# Patient Record
Sex: Female | Born: 1995 | Race: White | Hispanic: No | Marital: Married | Smoking: Never smoker
Health system: Southern US, Community
[De-identification: ages and names within clinical notes are randomized; demographics above are authoritative.]

## PROBLEM LIST (undated history)

## (undated) DIAGNOSIS — Z789 Other specified health status: Secondary | ICD-10-CM

## (undated) DIAGNOSIS — E739 Lactose intolerance, unspecified: Secondary | ICD-10-CM

## (undated) DIAGNOSIS — R197 Diarrhea, unspecified: Secondary | ICD-10-CM

## (undated) DIAGNOSIS — H5210 Myopia, unspecified eye: Secondary | ICD-10-CM

## (undated) DIAGNOSIS — R109 Unspecified abdominal pain: Secondary | ICD-10-CM

## (undated) HISTORY — DX: Diarrhea, unspecified: R19.7

## (undated) HISTORY — DX: Unspecified abdominal pain: R10.9

## (undated) HISTORY — PX: NO PAST SURGERIES: SHX2092

## (undated) HISTORY — DX: Myopia, unspecified eye: H52.10

## (undated) HISTORY — DX: Lactose intolerance, unspecified: E73.9

## (undated) HISTORY — DX: Other specified health status: Z78.9

---
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
## 2017-12-24 ENCOUNTER — Emergency Department (HOSPITAL_COMMUNITY)

## 2017-12-24 ENCOUNTER — Other Ambulatory Visit: Payer: Self-pay

## 2017-12-24 ENCOUNTER — Emergency Department (HOSPITAL_COMMUNITY)
Admission: EM | Admit: 2017-12-24 | Discharge: 2017-12-25 | Disposition: A | Discharge status: Home / Self Care | Attending: Emergency Medicine | Admitting: Emergency Medicine

## 2017-12-24 ENCOUNTER — Encounter (HOSPITAL_COMMUNITY): Payer: Self-pay | Admitting: Emergency Medicine

## 2017-12-24 DIAGNOSIS — R110 Nausea: Secondary | ICD-10-CM | POA: Insufficient documentation

## 2017-12-24 DIAGNOSIS — R1010 Upper abdominal pain, unspecified: Secondary | ICD-10-CM

## 2017-12-24 DIAGNOSIS — R1011 Right upper quadrant pain: Secondary | ICD-10-CM | POA: Insufficient documentation

## 2017-12-24 LAB — CBC WITH DIFFERENTIAL/PLATELET
Basophils Absolute: 0 10*3/uL (ref 0.0–0.1)
Basophils Relative: 1 %
Eosinophils Absolute: 0.1 10*3/uL (ref 0.0–0.7)
Eosinophils Relative: 2 %
HEMATOCRIT: 39.6 % (ref 36.0–46.0)
Hemoglobin: 12.7 g/dL (ref 12.0–15.0)
LYMPHS PCT: 50 %
Lymphs Abs: 2.9 10*3/uL (ref 0.7–4.0)
MCH: 28 pg (ref 26.0–34.0)
MCHC: 32.1 g/dL (ref 30.0–36.0)
MCV: 87.2 fL (ref 78.0–100.0)
MONO ABS: 0.4 10*3/uL (ref 0.1–1.0)
MONOS PCT: 7 %
NEUTROS ABS: 2.3 10*3/uL (ref 1.7–7.7)
Neutrophils Relative %: 40 %
Platelets: 290 10*3/uL (ref 150–400)
RBC: 4.54 MIL/uL (ref 3.87–5.11)
RDW: 13.7 % (ref 11.5–15.5)
WBC: 5.9 10*3/uL (ref 4.0–10.5)

## 2017-12-24 LAB — HEPATIC FUNCTION PANEL
ALT: 12 U/L — AB (ref 14–54)
AST: 18 U/L (ref 15–41)
Albumin: 4.1 g/dL (ref 3.5–5.0)
Alkaline Phosphatase: 68 U/L (ref 38–126)
BILIRUBIN DIRECT: 0.1 mg/dL (ref 0.1–0.5)
Indirect Bilirubin: 0.5 mg/dL (ref 0.3–0.9)
TOTAL PROTEIN: 6.9 g/dL (ref 6.5–8.1)
Total Bilirubin: 0.6 mg/dL (ref 0.3–1.2)

## 2017-12-24 LAB — URINALYSIS, ROUTINE W REFLEX MICROSCOPIC
Bilirubin Urine: NEGATIVE
GLUCOSE, UA: NEGATIVE mg/dL
KETONES UR: 5 mg/dL — AB
Nitrite: NEGATIVE
PROTEIN: 100 mg/dL — AB
Specific Gravity, Urine: 1.026 (ref 1.005–1.030)
pH: 5 (ref 5.0–8.0)

## 2017-12-24 LAB — BASIC METABOLIC PANEL
Anion gap: 8 (ref 5–15)
BUN: 10 mg/dL (ref 6–20)
CALCIUM: 9.3 mg/dL (ref 8.9–10.3)
CO2: 25 mmol/L (ref 22–32)
CREATININE: 0.69 mg/dL (ref 0.44–1.00)
Chloride: 109 mmol/L (ref 101–111)
GFR calc Af Amer: >60 mL/min (ref >60)
GFR calc non Af Amer: >60 mL/min (ref >60)
GLUCOSE: 121 mg/dL — AB (ref 65–99)
Potassium: 3.9 mmol/L (ref 3.5–5.1)
Sodium: 142 mmol/L (ref 135–145)

## 2017-12-24 LAB — PREGNANCY, URINE: PREG TEST UR: NEGATIVE

## 2017-12-24 LAB — LIPASE, BLOOD: Lipase: 36 U/L (ref 11–51)

## 2017-12-24 MED ORDER — GI COCKTAIL ~~LOC~~
30.0000 mL | Freq: Once | ORAL | Status: AC
  Administered 2017-12-24: 30 mL via ORAL
  Filled 2017-12-24: qty 30

## 2017-12-24 NOTE — ED Provider Notes (Signed)
[facility]-EMERGENCY DEPT Provider Note   CSN: [id number] Arrival date & time: 12/24/17  1907     History   Chief Complaint Chief Complaint  Patient presents with  . Abdominal Pain    HPI Amanda Morton is a 22 y.o. female.  22 year old female presents to the emergency department for evaluation of abdominal pain.  She reports sharp, intermittent pain in her upper abdomen which has been waxing and waning for the past 1.5 weeks.  She has had associated nausea, but no vomiting.  No medications taken prior to arrival for symptoms.  She reports normal, regular bowel movements.  No melena or hematochezia.  Patient further denies urinary symptoms, fever, history of abdominal surgeries.  Does note low grade temp of 100.30F a few days ago. She was diagnosed with strep 2 weeks ago and completed a course of antibiotics.  She was seen by her family doctor yesterday who ran blood tests and told her that she may have a virus.  The history is provided by the patient. No language interpreter was used.  Abdominal Pain      Past Medical History:  Diagnosis Date  . Abdominal pain   . Diarrhea   . Lactose intolerance   . Myopia     Patient Active Problem List   Diagnosis Date Noted  . Nausea alone 11/24/2013  . Poor weight gain (0-17) 11/24/2013  . Stool culture positive for Clostridium difficile 05/02/2013  . Neutropenia (HCC) 04/25/2013  . Abdominal bloating 04/07/2013  . Diarrhea   . Generalized abdominal cramping   . Short stature 03/15/2013  . Delayed bone age 28/04/2013    History reviewed. No pertinent surgical history.   OB History   None      Home Medications    Prior to Admission medications   Medication Sig Start Date End Date Taking? Authorizing Provider  ranitidine (ZANTAC) 150 MG capsule Take 1 capsule (150 mg total) by mouth daily as needed for heartburn. 12/25/17   Antony Madura, PA-C    Family History Family History  Problem Relation  Age of Onset  . Delayed puberty Mother   . Lactose intolerance Neg Hx   . Celiac disease Neg Hx   . Inflammatory bowel disease Neg Hx     Social History Social History   Tobacco Use  . Smoking status: Never Smoker  . Smokeless tobacco: Never Used  Substance Use Topics  . Alcohol use: No  . Drug use: No     Allergies   Patient has no known allergies.   Review of Systems Review of Systems  Gastrointestinal: Positive for abdominal pain.  Ten systems reviewed and are negative for acute change, except as noted in the HPI.    Physical Exam Updated Vital Signs BP 114/63 (BP Location: Right Arm)   Pulse 66   Temp 98.2 F (36.8 C) (Oral)   Resp 15   Ht 5\' 1"  (1.549 m)   Wt 45.4 kg (100 lb)   LMP  (LMP Unknown)   SpO2 99%   BMI 18.89 kg/m   Physical Exam  Constitutional: She is oriented to person, place, and time. She appears well-developed and well-nourished. No distress.  Nontoxic appearing and in no distress  HENT:  Head: Normocephalic and atraumatic.  Eyes: Conjunctivae and EOM are normal. No scleral icterus.  Neck: Normal range of motion.  Cardiovascular: Normal rate, regular rhythm and intact distal pulses.  Pulmonary/Chest: Effort normal. No stridor. No respiratory distress. She has no wheezes.  She has no rales.  Lungs clear to auscultation bilaterally  Abdominal: Soft.  Minimal right upper quadrant tenderness with negative Murphy sign.  Abdomen soft, nondistended.  No peritoneal signs or palpable masses.  Musculoskeletal: Normal range of motion.  Neurological: She is alert and oriented to person, place, and time. She exhibits normal muscle tone. Coordination normal.  GCS 15.  Patient ambulatory with steady gait.  Skin: Skin is warm and dry. No rash noted. She is not diaphoretic. No erythema. No pallor.  Psychiatric: She has a normal mood and affect. Her behavior is normal.  Nursing note and vitals reviewed.    ED Treatments / Results  Labs (all labs  ordered are listed, but only abnormal results are displayed) Labs Reviewed  BASIC METABOLIC PANEL - Abnormal; Notable for the following components:      Result Value   Glucose, Bld 121 (*)    All other components within normal limits  URINALYSIS, ROUTINE W REFLEX MICROSCOPIC - Abnormal; Notable for the following components:   APPearance TURBID (*)    Hgb urine dipstick MODERATE (*)    Ketones, ur 5 (*)    Protein, ur 100 (*)    Leukocytes, UA MODERATE (*)    Bacteria, UA MANY (*)    Squamous Epithelial / LPF TOO NUMEROUS TO COUNT (*)    Non Squamous Epithelial 0-5 (*)    All other components within normal limits  HEPATIC FUNCTION PANEL - Abnormal; Notable for the following components:   ALT 12 (*)    All other components within normal limits  URINALYSIS, ROUTINE W REFLEX MICROSCOPIC - Abnormal; Notable for the following components:   Ketones, ur 5 (*)    Leukocytes, UA SMALL (*)    Bacteria, UA RARE (*)    Squamous Epithelial / LPF 0-5 (*)    All other components within normal limits  CBC WITH DIFFERENTIAL/PLATELET  LIPASE, BLOOD  PREGNANCY, URINE    EKG None  Radiology [place] Abdomen Complete  Result Date: 12/25/2017 CLINICAL DATA:  Initial evaluation for acute abdominal pain. EXAM: ABDOMEN ULTRASOUND COMPLETE COMPARISON:  Prior radiograph from 12/15/2013 FINDINGS: Gallbladder: Gallbladder mildly contracted without internal stones or sludge. Gallbladder wall measure within normal limits at 2.2 mm. No free pericholecystic fluid. No sonographic Murphy sign elicited on exam. Common bile duct: Diameter: 2.6 mm Liver: No focal lesion identified. Within normal limits in parenchymal echogenicity. Portal vein is patent on color Doppler imaging with normal direction of blood flow towards the liver. IVC: No abnormality visualized. Pancreas: Visualized portion unremarkable. Spleen: Size and appearance within normal limits. Right Kidney: Length: 8.5 cm. Echogenicity within normal limits. No mass  or hydronephrosis visualized. Left Kidney: Length: 9.6 cm. Echogenicity within normal limits. No mass or hydronephrosis visualized. Abdominal aorta: No aneurysm visualized. Other findings: None. IMPRESSION: Normal abdominal ultrasound.  No acute abnormality identified. Electronically Signed   By: Rise MuBenjamin  McClintock M.D.   On: 12/25/2017 00:20    Procedures Procedures (including critical care time)  Medications Ordered in ED Medications  gi cocktail (Maalox,Lidocaine,Donnatal) (30 mLs Oral Given 12/24/17 2234)     Initial Impression / Assessment and Plan / ED Course  I have reviewed the triage vital signs and the nursing notes.  Pertinent labs & imaging results that were available during my care of the patient were reviewed by me and considered in my medical decision making (see chart for details).     22 year old female presenting to the emergency department for complaints of upper abdominal pain for  the past 1.5 weeks.  She has minimal tenderness in her right upper quadrant with negative Murphy sign.  No peritoneal signs on exam.  Abdomen soft.  She is afebrile and has reassuring vital signs.  These have remained stable since arrival.   Laboratory workup completed which shows no leukocytosis, anemia, electrolyte derangements.  Liver and kidney function preserved.  Initial urinalysis consistent with contamination.  This was improved on repeat testing, also consistent with contamination.  An ultrasound was performed to evaluate for liver, gallbladder, splenic etiology.  Imaging of the abdomen is unremarkable in its entirety.  Question whether there is a psychosomatic component to the patient's symptoms given her history.  She did have some improvement with a GI cocktail which may lend itself more to gastritis or reflux.  Plan to start on Zantac and continue outpatient follow-up with the patient's primary care doctor.  Return precautions discussed and provided. Patient discharged in stable  condition with no unaddressed concerns.   Final Clinical Impressions(s) / ED Diagnoses   Final diagnoses:  Pain of upper abdomen    ED Discharge Orders        Ordered    ranitidine (ZANTAC) 150 MG capsule  Daily PRN     12/25/17 0120       Antony Madura, PA-C 12/25/17 0256    Mancel Bale, MD 12/25/17 [phone]

## 2017-12-24 NOTE — ED Triage Notes (Addendum)
Pt arriving with upper abdominal pain on both sides. Pt reports pain has been present for 1.5 weeks. Pt denies taking anything for the pain prior to arrival. Pt reports having strep 2 weeks ago.

## 2017-12-25 LAB — URINALYSIS, ROUTINE W REFLEX MICROSCOPIC
BILIRUBIN URINE: NEGATIVE
Glucose, UA: NEGATIVE mg/dL
Hgb urine dipstick: NEGATIVE
KETONES UR: 5 mg/dL — AB
Nitrite: NEGATIVE
PH: 5 (ref 5.0–8.0)
PROTEIN: NEGATIVE mg/dL
Specific Gravity, Urine: 1.028 (ref 1.005–1.030)

## 2017-12-25 MED ORDER — RANITIDINE HCL 150 MG PO CAPS: 150.0000 mg | ORAL_CAPSULE | Freq: Every day | ORAL | 0 refills | Status: DC | PRN

## 2017-12-25 NOTE — Discharge Instructions (Addendum)
Your blood work and ultrasound in the emergency department today were reassuring.  You have been prescribed Zantac to take for persistent symptoms.  Continue with outpatient primary care follow-up.  You may return to the emergency department if symptoms persist or worsen.

## 2018-03-16 ENCOUNTER — Other Ambulatory Visit: Payer: Self-pay | Admitting: Obstetrics and Gynecology

## 2018-03-16 DIAGNOSIS — N6489 Other specified disorders of breast: Secondary | ICD-10-CM

## 2018-03-16 DIAGNOSIS — N6310 Unspecified lump in the right breast, unspecified quadrant: Secondary | ICD-10-CM

## 2018-03-22 ENCOUNTER — Ambulatory Visit
Admission: RE | Admit: 2018-03-22 | Discharge: 2018-03-22 | Disposition: A | Discharge status: Home / Self Care | Source: Ambulatory Visit | Attending: Obstetrics and Gynecology | Admitting: Obstetrics and Gynecology

## 2018-03-22 DIAGNOSIS — N6489 Other specified disorders of breast: Secondary | ICD-10-CM

## 2018-03-22 DIAGNOSIS — N6310 Unspecified lump in the right breast, unspecified quadrant: Secondary | ICD-10-CM

## 2018-09-12 IMAGING — US US ABDOMEN COMPLETE
1 series · 14 of 25 positions shown · non-contrast
Comparison: Prior radiograph from 12/15/2013

CLINICAL DATA: Initial evaluation for acute abdominal pain.

EXAM:
ABDOMEN ULTRASOUND COMPLETE

[Series 1: us abdomen complete · 14 of 178 slices shown]
[im 1/178]
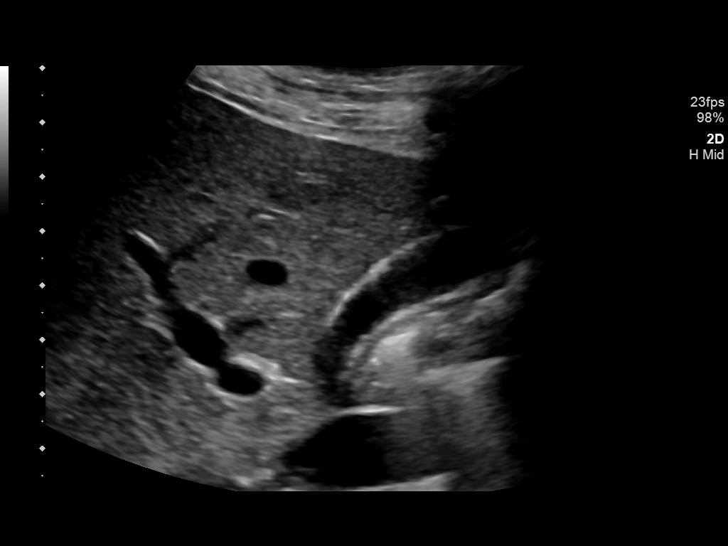
[im 15/178]
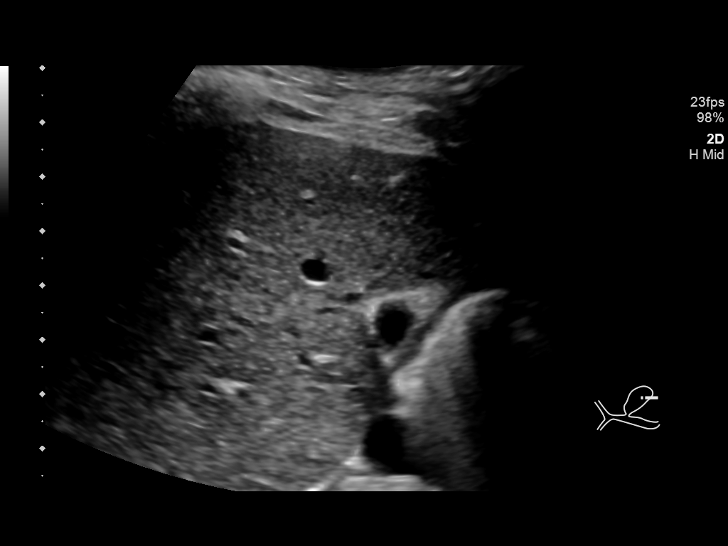
[im 30/178]
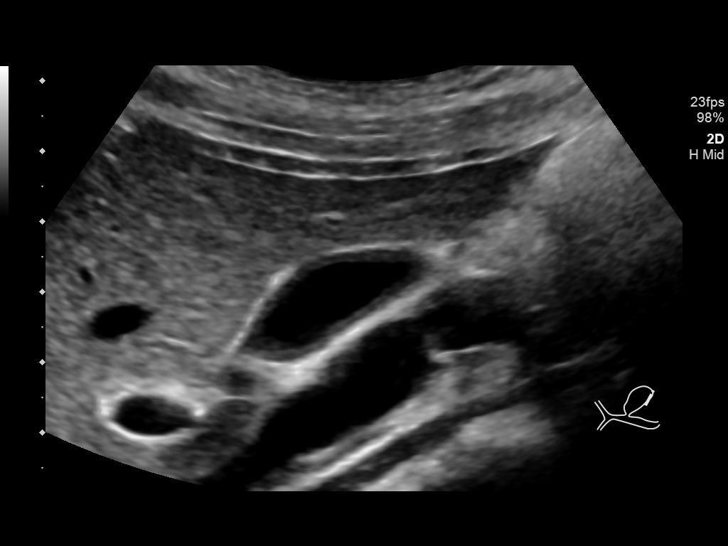
[im 45/178]
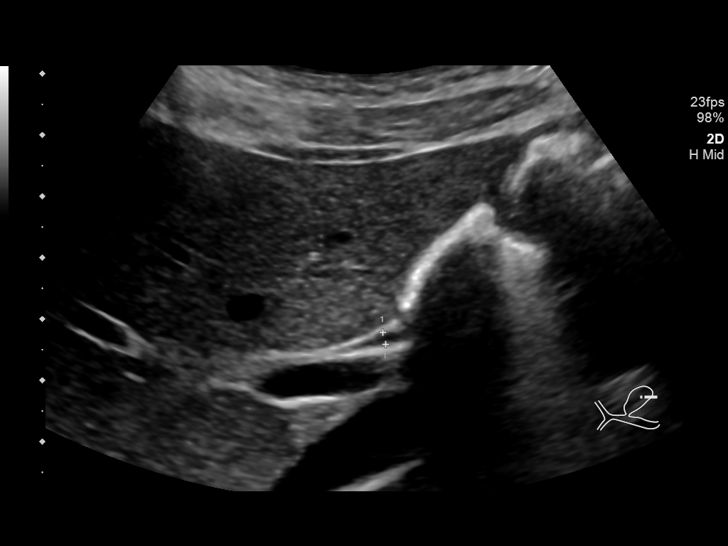
[im 60/178]
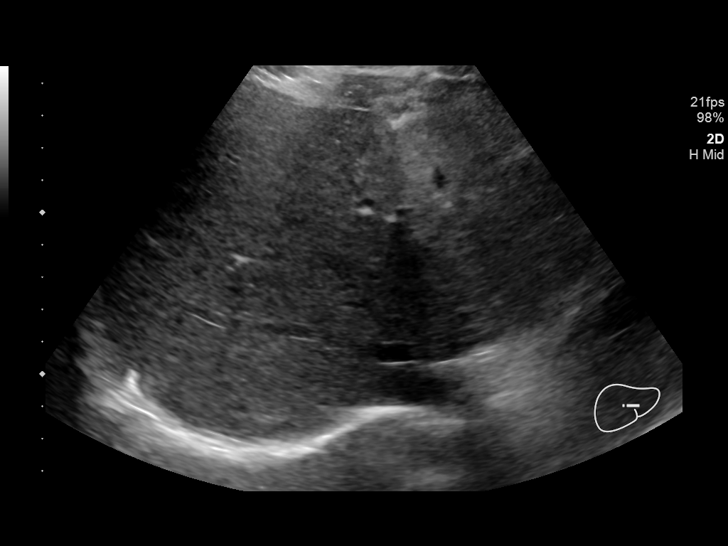
[im 67/178]
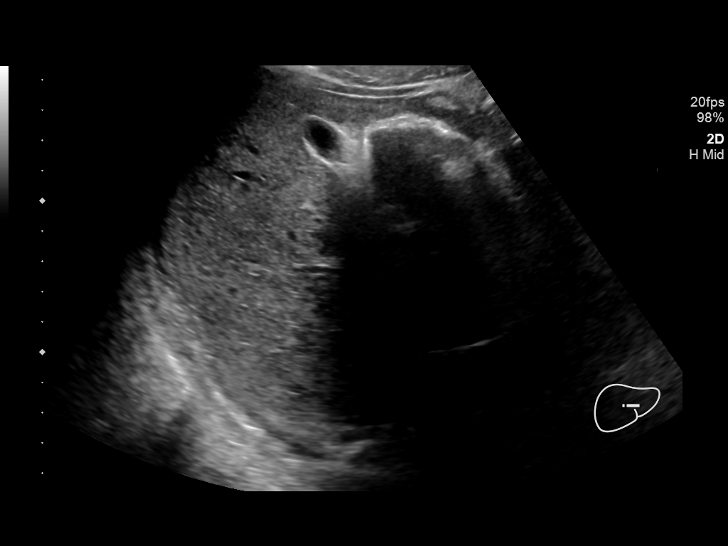
[im 82/178]
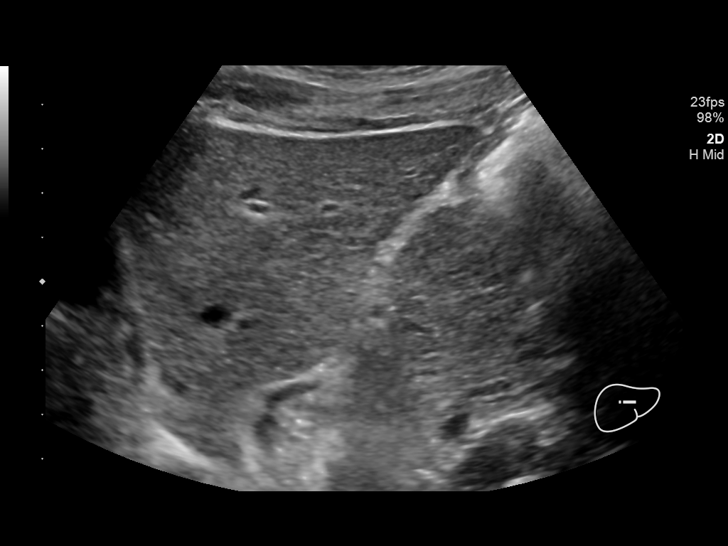
[im 96/178]
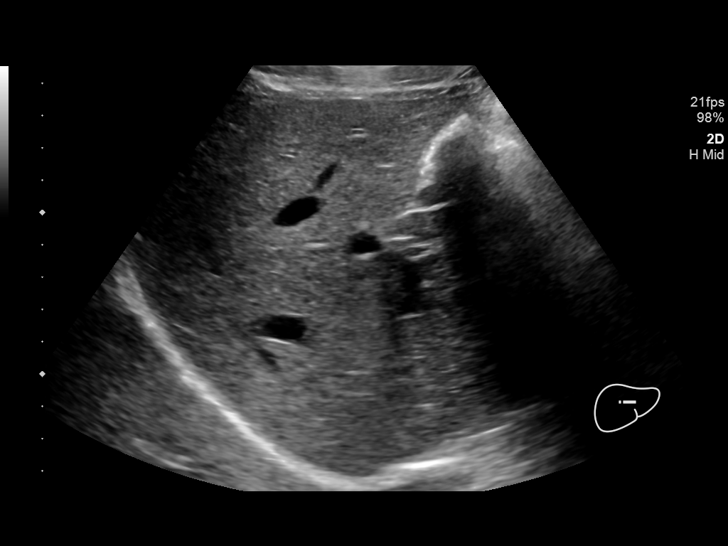
[im 111/178]
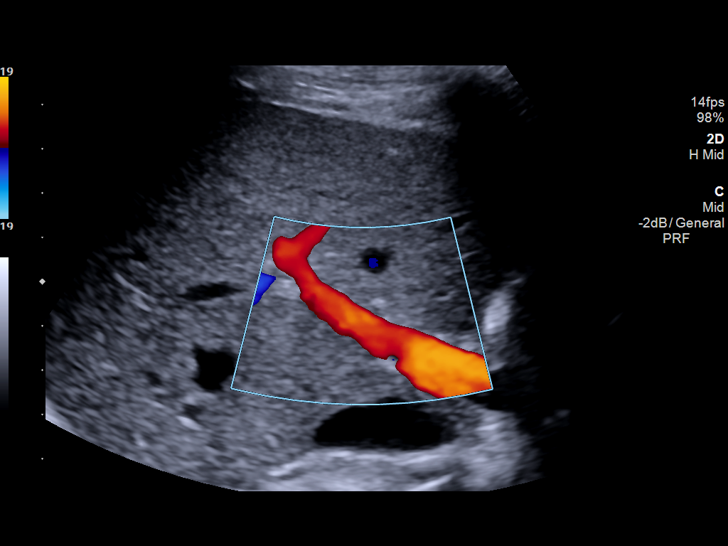
[im 119/178]
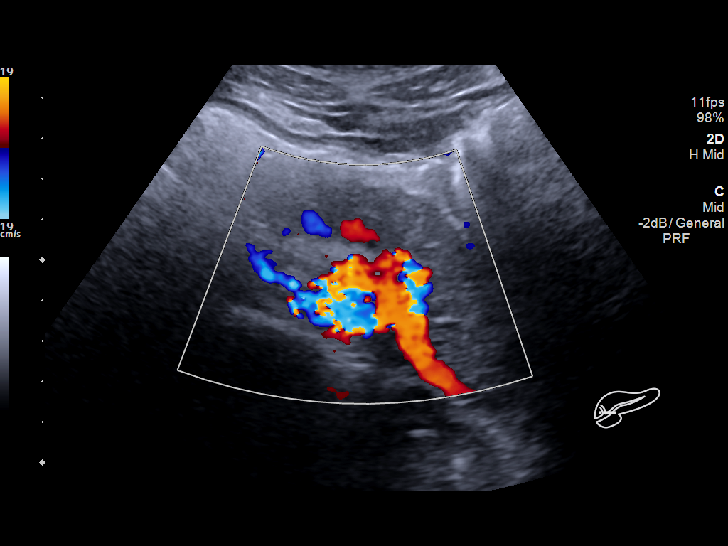
[im 133/178]
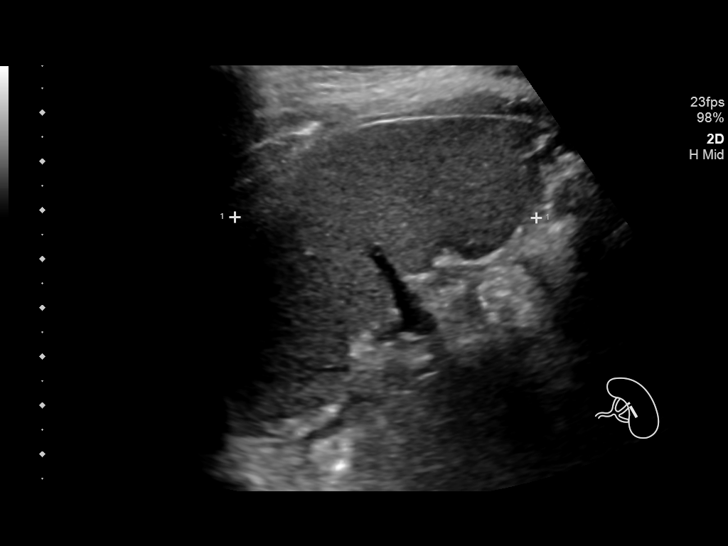
[im 148/178]
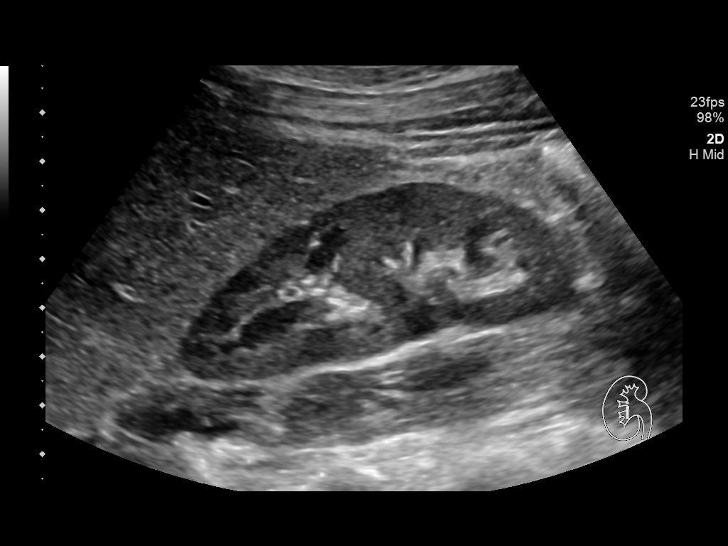
[im 163/178]
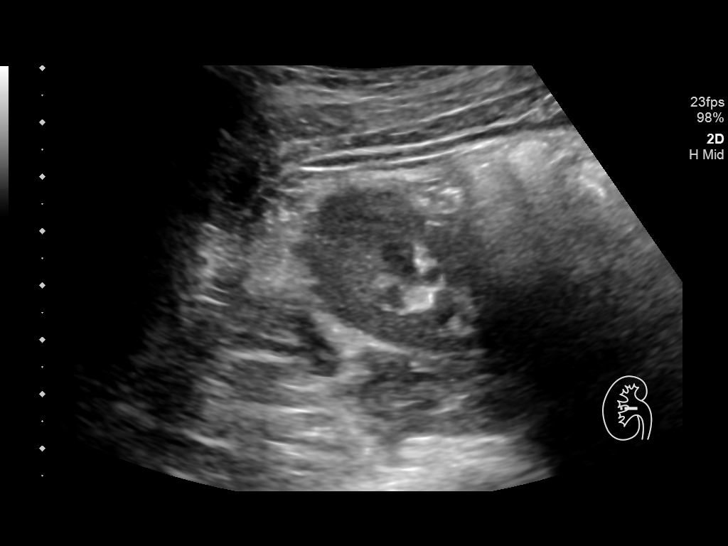
[im 178/178]
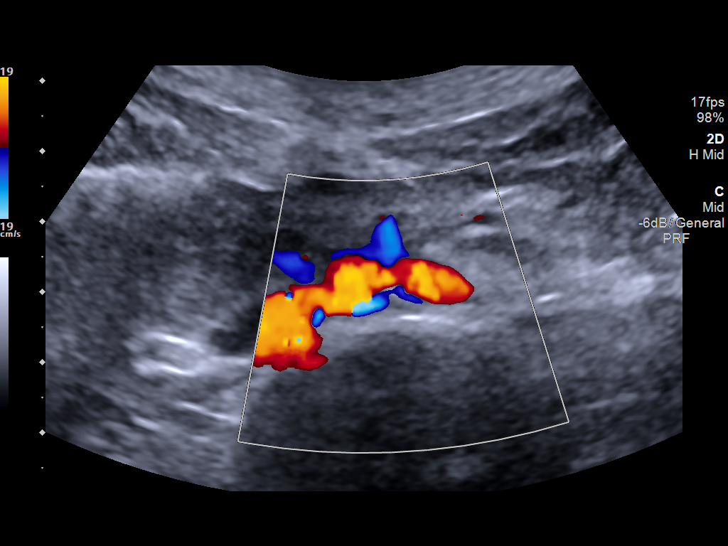

[14 of 25 positions shown; findings below may reference images not displayed]

FINDINGS: Gallbladder: Gallbladder mildly contracted without internal stones
or sludge. Gallbladder wall measure within normal limits at 2.2 mm.
No free pericholecystic fluid. No sonographic Murphy sign elicited
on exam.

Common bile duct: Diameter: 2.6 mm

Liver: No focal lesion identified. Within normal limits in
parenchymal echogenicity. Portal vein is patent on color Doppler
imaging with normal direction of blood flow towards the liver.

IVC: No abnormality visualized.

Pancreas: Visualized portion unremarkable.

Spleen: Size and appearance within normal limits.

Right Kidney: Length: 8.5 cm. Echogenicity within normal limits. No
mass or hydronephrosis visualized.

Left Kidney: Length: 9.6 cm. Echogenicity within normal limits. No
mass or hydronephrosis visualized.

Abdominal aorta: No aneurysm visualized.

Other findings: None.
IMPRESSION: Normal abdominal ultrasound.  No acute abnormality identified.

## 2021-11-12 LAB — OB RESULTS CONSOLE RPR: RPR: NONREACTIVE

## 2021-11-12 LAB — OB RESULTS CONSOLE GBS: GBS: POSITIVE

## 2021-11-12 LAB — OB RESULTS CONSOLE HEPATITIS B SURFACE ANTIGEN: Hepatitis B Surface Ag: NEGATIVE

## 2021-11-12 LAB — OB RESULTS CONSOLE RUBELLA ANTIBODY, IGM: Rubella: IMMUNE

## 2021-11-12 LAB — OB RESULTS CONSOLE GC/CHLAMYDIA: Chlamydia: NEGATIVE

## 2021-11-22 LAB — OB RESULTS CONSOLE GC/CHLAMYDIA: Neisseria Gonorrhea: NEGATIVE

## 2022-02-21 LAB — OB RESULTS CONSOLE ABO/RH

## 2022-02-21 LAB — OB RESULTS CONSOLE HIV ANTIBODY (ROUTINE TESTING): HIV: NONREACTIVE

## 2022-05-11 ENCOUNTER — Encounter (HOSPITAL_COMMUNITY): Payer: Self-pay | Admitting: Obstetrics and Gynecology

## 2022-05-11 ENCOUNTER — Inpatient Hospital Stay (EMERGENCY_DEPARTMENT_HOSPITAL)
Admission: AD | Admit: 2022-05-11 | Discharge: 2022-05-11 | Disposition: A | Discharge status: Home / Self Care | Payer: Medicaid Other | Source: Home / Self Care | Attending: Obstetrics and Gynecology | Admitting: Obstetrics and Gynecology

## 2022-05-11 ENCOUNTER — Inpatient Hospital Stay (HOSPITAL_BASED_OUTPATIENT_CLINIC_OR_DEPARTMENT_OTHER): Payer: Medicaid Other

## 2022-05-11 ENCOUNTER — Inpatient Hospital Stay (HOSPITAL_COMMUNITY)
Admission: AD | Admit: 2022-05-11 | Discharge: 2022-05-11 | Disposition: A | Discharge status: Home / Self Care | Payer: Medicaid Other | Source: Home / Self Care | Attending: Obstetrics & Gynecology | Admitting: Obstetrics & Gynecology

## 2022-05-11 ENCOUNTER — Encounter (HOSPITAL_COMMUNITY): Payer: Self-pay | Admitting: Obstetrics & Gynecology

## 2022-05-11 ENCOUNTER — Other Ambulatory Visit: Payer: Self-pay

## 2022-05-11 DIAGNOSIS — O4202 Full-term premature rupture of membranes, onset of labor within 24 hours of rupture: Secondary | ICD-10-CM | POA: Diagnosis not present

## 2022-05-11 DIAGNOSIS — Z3A39 39 weeks gestation of pregnancy: Secondary | ICD-10-CM

## 2022-05-11 DIAGNOSIS — O368330 Maternal care for abnormalities of the fetal heart rate or rhythm, third trimester, not applicable or unspecified: Secondary | ICD-10-CM | POA: Insufficient documentation

## 2022-05-11 DIAGNOSIS — O471 False labor at or after 37 completed weeks of gestation: Secondary | ICD-10-CM | POA: Insufficient documentation

## 2022-05-11 DIAGNOSIS — O288 Other abnormal findings on antenatal screening of mother: Secondary | ICD-10-CM

## 2022-05-11 DIAGNOSIS — O479 False labor, unspecified: Secondary | ICD-10-CM | POA: Diagnosis not present

## 2022-05-11 DIAGNOSIS — O4693 Antepartum hemorrhage, unspecified, third trimester: Secondary | ICD-10-CM

## 2022-05-11 DIAGNOSIS — O99820 Streptococcus B carrier state complicating pregnancy: Secondary | ICD-10-CM | POA: Diagnosis not present

## 2022-05-11 LAB — URINALYSIS, ROUTINE W REFLEX MICROSCOPIC
Bilirubin Urine: NEGATIVE
Glucose, UA: NEGATIVE mg/dL
Hgb urine dipstick: NEGATIVE
Ketones, ur: NEGATIVE mg/dL
Leukocytes,Ua: NEGATIVE
Nitrite: NEGATIVE
Protein, ur: NEGATIVE mg/dL
Specific Gravity, Urine: 1.015 (ref 1.005–1.030)
pH: 6 (ref 5.0–8.0)

## 2022-05-11 NOTE — MAU Provider Note (Signed)
S: Ms. Kirstein Baxley is a 26 y.o. G1P0 at [redacted]w[redacted]d  who presents to MAU today complaining contractions q 2-3 minutes since this morning. She denies vaginal bleeding. She denies LOF. She reports normal fetal movement.    O: BP 135/84   Pulse 88   Temp 98 F (36.7 C) (Oral)   Resp 16   Ht 5\' 1"  (1.549 m)   Wt 72.3 kg   SpO2 100%   BMI 30.12 kg/m  GENERAL: Well-developed, well-nourished female in no acute distress.  HEAD: Normocephalic, atraumatic.  CHEST: Normal effort of breathing, regular heart rate ABDOMEN: Soft, nontender, gravid  Cervical exam:  Dilation: 1 Effacement (%): 50 Cervical Position: Posterior Station: -3 Presentation: Vertex Exam by:: [email or web address] RN   Fetal Monitoring: Baseline: 140 bpm Variability: Moderate  Accelerations: 15x15 Decelerations: variable  Contractions: Frequent.    A: SIUP at [redacted]w[redacted]d  MSE Done  False labor  P: Return to MAU if symptoms worsen Labor precautions     Leah Skora, [redacted]w[redacted]d, NP 05/11/2022 10:42 AM

## 2022-05-11 NOTE — MAU Note (Signed)
Pt reports to mau with c/o ctx q 5 min since 0600.  Denies LOF or vag bleeding. +FM Reports cervix was closed in office on Friday.

## 2022-05-11 NOTE — MAU Provider Note (Signed)
S: Ms. Amanda Morton is a 26 y.o. G1P0 at [redacted]w[redacted]d  who presents to MAU today complaining contractions q 3-5 minutes. She denies vaginal bleeding. She denies LOF. She reports normal fetal movement.    O: BP 126/62   Pulse 85   Temp 97.8 F (36.6 C)   Resp 20   SpO2 100%  GENERAL: Well-developed, well-nourished female in no acute distress.  HEAD: Normocephalic, atraumatic.  CHEST: Normal effort of breathing, regular heart rate ABDOMEN: Soft, nontender, gravid  Cervical exam:  Dilation: 1 Effacement (%): 50 Cervical Position: Posterior Station: -3 Presentation: Vertex Exam by:: Zenia Resides, RN   Fetal Monitoring: Baseline: 135bpm Variability: moderate Accelerations: >2 15x15 accels Decelerations: no decels Contractions: every 7-10 mins  Initial 30 minutes of her EFM had shown only 1 acceleration, and so a BPP was ordered and came back 8/8   A: SIUP at [redacted]w[redacted]d  False labor  P: Stable for discharge home.  Encouraged to come back when contractions are more intense.   Alfredia Ferguson, MD 05/11/2022 10:20 PM

## 2022-05-11 NOTE — MAU Note (Signed)
.  Amanda Morton is a 26 y.o. at [redacted]w[redacted]d here in MAU reporting: Pt reports she was here for a labor check earlier and was sent home because she had not made change. Pt reports she is back now because her mom and her husband thought she should be rechecked again.  Reports spotting.  Reports +FM  Denies LOF   Onset of complaint: 1-2 weeks, ctx's got worse today  Pain score: 4/10 abdomen intermittent 4/10 back constant  There were no vitals filed for this visit.    Lab orders placed from triage:   none

## 2022-05-12 ENCOUNTER — Other Ambulatory Visit: Payer: Self-pay

## 2022-05-12 ENCOUNTER — Inpatient Hospital Stay (HOSPITAL_COMMUNITY): Payer: Medicaid Other | Admitting: Anesthesiology

## 2022-05-12 ENCOUNTER — Encounter (HOSPITAL_COMMUNITY): Payer: Self-pay | Admitting: Obstetrics & Gynecology

## 2022-05-12 ENCOUNTER — Inpatient Hospital Stay (HOSPITAL_COMMUNITY)
Admission: AD | Admit: 2022-05-12 | Discharge: 2022-05-16 | DRG: 787 | Disposition: A | Discharge status: Home / Self Care | Payer: Medicaid Other | Attending: Obstetrics and Gynecology | Admitting: Obstetrics and Gynecology

## 2022-05-12 ENCOUNTER — Inpatient Hospital Stay (EMERGENCY_DEPARTMENT_HOSPITAL)
Admission: AD | Admit: 2022-05-12 | Discharge: 2022-05-12 | Disposition: A | Discharge status: Home / Self Care | Payer: Medicaid Other | Source: Home / Self Care | Attending: Obstetrics & Gynecology | Admitting: Obstetrics & Gynecology

## 2022-05-12 DIAGNOSIS — O471 False labor at or after 37 completed weeks of gestation: Secondary | ICD-10-CM | POA: Insufficient documentation

## 2022-05-12 DIAGNOSIS — O9081 Anemia of the puerperium: Secondary | ICD-10-CM | POA: Diagnosis not present

## 2022-05-12 DIAGNOSIS — O4202 Full-term premature rupture of membranes, onset of labor within 24 hours of rupture: Secondary | ICD-10-CM

## 2022-05-12 DIAGNOSIS — O99820 Streptococcus B carrier state complicating pregnancy: Secondary | ICD-10-CM

## 2022-05-12 DIAGNOSIS — Z6791 Unspecified blood type, Rh negative: Secondary | ICD-10-CM

## 2022-05-12 DIAGNOSIS — O26893 Other specified pregnancy related conditions, third trimester: Secondary | ICD-10-CM | POA: Diagnosis present

## 2022-05-12 DIAGNOSIS — D62 Acute posthemorrhagic anemia: Secondary | ICD-10-CM | POA: Diagnosis not present

## 2022-05-12 DIAGNOSIS — Z3490 Encounter for supervision of normal pregnancy, unspecified, unspecified trimester: Principal | ICD-10-CM

## 2022-05-12 DIAGNOSIS — Z3A39 39 weeks gestation of pregnancy: Secondary | ICD-10-CM | POA: Insufficient documentation

## 2022-05-12 DIAGNOSIS — O99824 Streptococcus B carrier state complicating childbirth: Secondary | ICD-10-CM | POA: Diagnosis present

## 2022-05-12 LAB — CBC
HCT: 35.9 % — ABNORMAL LOW (ref 36.0–46.0)
Hemoglobin: 11.9 g/dL — ABNORMAL LOW (ref 12.0–15.0)
MCH: 29.1 pg (ref 26.0–34.0)
MCHC: 33.1 g/dL (ref 30.0–36.0)
MCV: 87.8 fL (ref 80.0–100.0)
Platelets: 231 10*3/uL (ref 150–400)
RBC: 4.09 MIL/uL (ref 3.87–5.11)
RDW: 13.4 % (ref 11.5–15.5)
WBC: 8.1 10*3/uL (ref 4.0–10.5)
nRBC: 0 % (ref 0.0–0.2)

## 2022-05-12 LAB — TYPE AND SCREEN
ABO/RH(D): O NEG
Antibody Screen: NEGATIVE
Weak D: POSITIVE

## 2022-05-12 MED ORDER — SODIUM CHLORIDE 0.9 % IV SOLN
5.0000 10*6.[IU] | Freq: Once | INTRAVENOUS | Status: AC
  Administered 2022-05-12: 5 10*6.[IU] via INTRAVENOUS
  Filled 2022-05-12: qty 5

## 2022-05-12 MED ORDER — ONDANSETRON HCL 4 MG/2ML IJ SOLN
4.0000 mg | Freq: Four times a day (QID) | INTRAMUSCULAR | Status: DC | PRN
  Administered 2022-05-13: 4 mg via INTRAVENOUS
  Filled 2022-05-12: qty 2

## 2022-05-12 MED ORDER — FENTANYL-BUPIVACAINE-NACL 0.5-0.125-0.9 MG/250ML-% EP SOLN
12.0000 mL/h | EPIDURAL | Status: DC | PRN
  Administered 2022-05-12 – 2022-05-13 (×2): 12 mL/h via EPIDURAL
  Filled 2022-05-12 (×2): qty 250

## 2022-05-12 MED ORDER — PENICILLIN G POT IN DEXTROSE 60000 UNIT/ML IV SOLN
3.0000 10*6.[IU] | INTRAVENOUS | Status: DC
  Administered 2022-05-13 (×4): 3 10*6.[IU] via INTRAVENOUS
  Filled 2022-05-12 (×4): qty 50

## 2022-05-12 MED ORDER — OXYCODONE-ACETAMINOPHEN 5-325 MG PO TABS: 2.0000 | ORAL_TABLET | ORAL | Status: DC | PRN

## 2022-05-12 MED ORDER — OXYTOCIN-SODIUM CHLORIDE 30-0.9 UT/500ML-% IV SOLN
1.0000 m[IU]/min | INTRAVENOUS | Status: DC
  Administered 2022-05-12: 2 m[IU]/min via INTRAVENOUS

## 2022-05-12 MED ORDER — PHENYLEPHRINE 80 MCG/ML (10ML) SYRINGE FOR IV PUSH (FOR BLOOD PRESSURE SUPPORT): 80.0000 ug | PREFILLED_SYRINGE | INTRAVENOUS | Status: DC | PRN

## 2022-05-12 MED ORDER — SOD CITRATE-CITRIC ACID 500-334 MG/5ML PO SOLN
30.0000 mL | ORAL | Status: DC | PRN
  Administered 2022-05-13: 30 mL via ORAL
  Filled 2022-05-12: qty 30

## 2022-05-12 MED ORDER — MORPHINE SULFATE (PF) 10 MG/ML IV SOLN: 10.0000 mg | Freq: Once | INTRAVENOUS | Status: DC

## 2022-05-12 MED ORDER — LACTATED RINGERS IV SOLN
500.0000 mL | INTRAVENOUS | Status: DC | PRN
  Administered 2022-05-13: 500 mL via INTRAVENOUS

## 2022-05-12 MED ORDER — FENTANYL CITRATE (PF) 100 MCG/2ML IJ SOLN: 50.0000 ug | INTRAMUSCULAR | Status: DC | PRN

## 2022-05-12 MED ORDER — OXYTOCIN-SODIUM CHLORIDE 30-0.9 UT/500ML-% IV SOLN
2.5000 [IU]/h | INTRAVENOUS | Status: DC
  Filled 2022-05-12: qty 500

## 2022-05-12 MED ORDER — EPHEDRINE 5 MG/ML INJ: 10.0000 mg | INTRAVENOUS | Status: DC | PRN

## 2022-05-12 MED ORDER — OXYTOCIN BOLUS FROM INFUSION: 333.0000 mL | Freq: Once | INTRAVENOUS | Status: DC

## 2022-05-12 MED ORDER — LACTATED RINGERS IV SOLN: INTRAVENOUS | Status: DC

## 2022-05-12 MED ORDER — DIPHENHYDRAMINE HCL 50 MG/ML IJ SOLN: 25.0000 mg | Freq: Once | INTRAMUSCULAR | Status: DC

## 2022-05-12 MED ORDER — LIDOCAINE HCL (PF) 1 % IJ SOLN
INTRAMUSCULAR | Status: DC | PRN
  Administered 2022-05-12 (×2): 4 mL via EPIDURAL

## 2022-05-12 MED ORDER — ACETAMINOPHEN 325 MG PO TABS: 650.0000 mg | ORAL_TABLET | ORAL | Status: DC | PRN

## 2022-05-12 MED ORDER — OXYCODONE-ACETAMINOPHEN 5-325 MG PO TABS: 1.0000 | ORAL_TABLET | ORAL | Status: DC | PRN

## 2022-05-12 MED ORDER — LACTATED RINGERS IV SOLN: 500.0000 mL | Freq: Once | INTRAVENOUS | Status: DC

## 2022-05-12 MED ORDER — TERBUTALINE SULFATE 1 MG/ML IJ SOLN: 0.2500 mg | Freq: Once | INTRAMUSCULAR | Status: DC | PRN

## 2022-05-12 MED ORDER — DIPHENHYDRAMINE HCL 50 MG/ML IJ SOLN: 12.5000 mg | INTRAMUSCULAR | Status: DC | PRN

## 2022-05-12 MED ORDER — LIDOCAINE HCL (PF) 1 % IJ SOLN: 30.0000 mL | INTRAMUSCULAR | Status: DC | PRN

## 2022-05-12 NOTE — Anesthesia Preprocedure Evaluation (Signed)
Anesthesia Evaluation  Patient identified by MRN, date of birth, ID band  Reviewed: Allergy & Precautions, Patient's Chart, lab work & pertinent test results  History of Anesthesia Complications Negative for: history of anesthetic complications  Airway Mallampati: II  TM Distance: >3 FB Neck ROM: Full    Dental no notable dental hx.    Pulmonary neg pulmonary ROS,    Pulmonary exam normal        Cardiovascular negative cardio ROS Normal cardiovascular exam     Neuro/Psych negative neurological ROS  negative psych ROS   GI/Hepatic negative GI ROS, Neg liver ROS,   Endo/Other  negative endocrine ROS  Renal/GU negative Renal ROS  negative genitourinary   Musculoskeletal negative musculoskeletal ROS (+)   Abdominal   Peds  Hematology negative hematology ROS (+)   Anesthesia Other Findings Day of surgery medications reviewed with patient.  Reproductive/Obstetrics (+) Pregnancy                             Anesthesia Physical Anesthesia Plan  ASA: 2  Anesthesia Plan: Epidural   Post-op Pain Management:    Induction:   PONV Risk Score and Plan: Treatment may vary due to age or medical condition  Airway Management Planned: Natural Airway  Additional Equipment: Fetal Monitoring  Intra-op Plan:   Post-operative Plan:   Informed Consent: I have reviewed the patients History and Physical, chart, labs and discussed the procedure including the risks, benefits and alternatives for the proposed anesthesia with the patient or authorized representative who has indicated his/her understanding and acceptance.       Plan Discussed with:   Anesthesia Plan Comments:         Anesthesia Quick Evaluation

## 2022-05-12 NOTE — MAU Provider Note (Signed)
S: [name] is a 26 y.o. G1P0 at [redacted]w[redacted]d  who presents to MAU today complaining contractions q 3-4 minutes since 9/3. She denies vaginal bleeding. She denies LOF. She reports normal fetal movement.    O: BP 130/76   Pulse 88   Temp 98 F (36.7 C)   Resp 18   Ht 5\' 1"  (1.549 m)   Wt 72.1 kg   BMI 30.04 kg/m  GENERAL: Well-developed, well-nourished female in no acute distress.  HEAD: Normocephalic, atraumatic.  CHEST: Normal effort of breathing, regular heart rate ABDOMEN: Soft, nontender, gravid  Cervical exam:  Dilation: 2 Effacement (%): 60 Cervical Position: Anterior Station: -3, -2 Presentation: Vertex Exam by:: K.Wilson,RN   Fetal Monitoring: Category 1 fetal tracing Discussed admission with Dr. [email or web address]. Patient has been to MAU 3 times in the past 2 days with painful contractions.    A: SIUP at [redacted]w[redacted]d  Early labor @ term   P:  Admit to labor and delivery per Dr. [redacted]w[redacted]d.   Langston Masker, NP 05/12/2022 8:41 PM

## 2022-05-12 NOTE — Anesthesia Procedure Notes (Signed)
Epidural Patient location during procedure: OB Start time: 05/12/2022 10:42 PM End time: 05/12/2022 10:45 PM  Staffing Anesthesiologist: Kaylyn Layer, MD Performed: anesthesiologist   Preanesthetic Checklist Completed: patient identified, IV checked, risks and benefits discussed, monitors and equipment checked, pre-op evaluation and timeout performed  Epidural Patient position: sitting Prep: DuraPrep and site prepped and draped Patient monitoring: continuous pulse ox, blood pressure and heart rate Approach: midline Location: L3-L4 Injection technique: LOR air  Needle:  Needle type: Tuohy  Needle gauge: 17 G Needle length: 9 cm Needle insertion depth: 5 cm Catheter type: closed end flexible Catheter size: 19 Gauge Catheter at skin depth: 10 cm Test dose: negative and Other (1% lidocaine)  Assessment Events: blood not aspirated, injection not painful, no injection resistance, no paresthesia and negative IV test  Additional Notes Patient identified. Risks, benefits, and alternatives discussed with patient including but not limited to bleeding, infection, nerve damage, paralysis, failed block, incomplete pain control, headache, blood pressure changes, nausea, vomiting, reactions to medication, itching, and postpartum back pain. Confirmed with bedside nurse the patient's most recent platelet count. Confirmed with patient that they are not currently taking any anticoagulation, have any bleeding history, or any family history of bleeding disorders. Patient expressed understanding and wished to proceed. All questions were answered. Sterile technique was used throughout the entire procedure. Please see nursing notes for vital signs.   Crisp LOR on first pass. Test dose was given through epidural catheter and negative prior to continuing to dose epidural or start infusion. Warning signs of high block given to the patient including shortness of breath, tingling/numbness in hands, complete  motor block, or any concerning symptoms with instructions to call for help. Patient was given instructions on fall risk and not to get out of bed. All questions and concerns addressed with instructions to call with any issues or inadequate analgesia.  Reason for block:procedure for pain

## 2022-05-12 NOTE — MAU Note (Signed)
.  Sharaya Klang is a 26 y.o. at [redacted]w[redacted]d here in MAU reporting: Pt was here earlier. Pt states Ctx are 3-5 min apart and are worst then when she was here earlier. NOLOF +FM. Reports some spotting from cervical exams earlier.   Onset of complaint:0200 ctx become stronger.  Pain score: 4/10 ctx pain  BP. 129/65, 02 100, Temp 98.1. Resp 19   FHT:146 Lab orders placed from triage:   Labor eval.

## 2022-05-12 NOTE — MAU Note (Signed)
.  Amanda Morton is a 26 y.o. at [redacted]w[redacted]d here in MAU reporting: ctx on and of all weekend. Today they are much worse about 4-5 min apart. Reports some mucusy discharge but no bleeding . Good fetal movement felt. LMP:  Onset of complaint: 2-3 days Pain score: 5 Vitals:   05/12/22 1959  BP: 130/76  Pulse: 88  Resp: 18  Temp: 98 F (36.7 C)     FHT:135 Lab orders placed from triage:  mau labor assessment

## 2022-05-12 NOTE — MAU Provider Note (Signed)
  S: Ms. Martika Egler is a 26 y.o. G1P0 at [redacted]w[redacted]d  who presents to MAU today complaining contractions q 3-5 minutes. She was seen last night and discharged home. Back here due to worsening painful contractions. She denies vaginal bleeding. She denies LOF. She reports normal fetal movement.    O: BP 128/65 (BP Location: Right Arm)   Pulse 81   Temp 98.1 F (36.7 C) (Oral)   Resp 19   Ht 5\' 1"  (1.549 m)   Wt 72.1 kg   SpO2 99%   BMI 30.04 kg/m  GENERAL: Well-developed, well-nourished female in no acute distress.  HEAD: Normocephalic, atraumatic.  CHEST: Normal effort of breathing, regular heart rate ABDOMEN: Soft, nontender, gravid. Intermm  Cervical exam:  Dilation: 1.5 Effacement (%): 60 Cervical Position: Posterior Station: -3 Presentation: Vertex Exam by:: [email or web address] RN.   Fetal Monitoring: Baseline: 130 bpm Variability: moderate Accelerations: > 2 15 X 15 accels  Decelerations: no decels Contractions: every 3-6 mins   A: SIUP at [redacted]w[redacted]d  False labor -cervix still unchanged.  Patient concerned about pain.  She is likely in early labor.  I offered to give a dose of IM morphine and diphenhydramine to help with pain and allow her labor at home.  She declines these.  Okay to discharge home.   P: Discharge home. Return with more painful contractions   Jamiee Milholland J, MD 05/12/2022 5:52 AM

## 2022-05-13 ENCOUNTER — Encounter (HOSPITAL_COMMUNITY): Payer: Self-pay | Admitting: Obstetrics & Gynecology

## 2022-05-13 ENCOUNTER — Encounter (HOSPITAL_COMMUNITY)
Admission: AD | Disposition: A | Discharge status: Home / Self Care | Payer: Self-pay | Source: Home / Self Care | Attending: Obstetrics and Gynecology

## 2022-05-13 DIAGNOSIS — Z3A39 39 weeks gestation of pregnancy: Secondary | ICD-10-CM

## 2022-05-13 LAB — RPR: RPR Ser Ql: NONREACTIVE

## 2022-05-13 SURGERY — Surgical Case
Anesthesia: Epidural

## 2022-05-13 MED ORDER — ACETAMINOPHEN 10 MG/ML IV SOLN
INTRAVENOUS | Status: AC
  Filled 2022-05-13: qty 100

## 2022-05-13 MED ORDER — KETOROLAC TROMETHAMINE 30 MG/ML IJ SOLN
30.0000 mg | Freq: Once | INTRAMUSCULAR | Status: AC
  Administered 2022-05-13: 30 mg via INTRAVENOUS

## 2022-05-13 MED ORDER — MORPHINE SULFATE (PF) 0.5 MG/ML IJ SOLN
INTRAMUSCULAR | Status: AC
  Filled 2022-05-13: qty 10

## 2022-05-13 MED ORDER — ONDANSETRON HCL 4 MG/2ML IJ SOLN
INTRAMUSCULAR | Status: DC | PRN
  Administered 2022-05-13: 4 mg via INTRAVENOUS

## 2022-05-13 MED ORDER — LIDOCAINE-EPINEPHRINE (PF) 2 %-1:200000 IJ SOLN
INTRAMUSCULAR | Status: AC
  Filled 2022-05-13: qty 20

## 2022-05-13 MED ORDER — WITCH HAZEL-GLYCERIN EX PADS: 1.0000 | MEDICATED_PAD | CUTANEOUS | Status: DC | PRN

## 2022-05-13 MED ORDER — OXYTOCIN-SODIUM CHLORIDE 30-0.9 UT/500ML-% IV SOLN
INTRAVENOUS | Status: DC | PRN
  Administered 2022-05-13: 200 mL via INTRAVENOUS

## 2022-05-13 MED ORDER — SENNOSIDES-DOCUSATE SODIUM 8.6-50 MG PO TABS
2.0000 | ORAL_TABLET | Freq: Every day | ORAL | Status: DC
  Administered 2022-05-14 – 2022-05-16 (×3): 2 via ORAL
  Filled 2022-05-13 (×3): qty 2

## 2022-05-13 MED ORDER — KETOROLAC TROMETHAMINE 30 MG/ML IJ SOLN
INTRAMUSCULAR | Status: AC
  Filled 2022-05-13: qty 1

## 2022-05-13 MED ORDER — MORPHINE SULFATE (PF) 0.5 MG/ML IJ SOLN
INTRAMUSCULAR | Status: DC | PRN
  Administered 2022-05-13: 3 mg via EPIDURAL

## 2022-05-13 MED ORDER — ACETAMINOPHEN 10 MG/ML IV SOLN
INTRAVENOUS | Status: DC | PRN
  Administered 2022-05-13: 1000 mg via INTRAVENOUS

## 2022-05-13 MED ORDER — DEXAMETHASONE SODIUM PHOSPHATE 10 MG/ML IJ SOLN
INTRAMUSCULAR | Status: DC | PRN
  Administered 2022-05-13: 10 mg via INTRAVENOUS

## 2022-05-13 MED ORDER — OXYTOCIN-SODIUM CHLORIDE 30-0.9 UT/500ML-% IV SOLN
1.0000 m[IU]/min | INTRAVENOUS | Status: DC
  Administered 2022-05-13: 1 m[IU]/min via INTRAVENOUS

## 2022-05-13 MED ORDER — OXYCODONE HCL 5 MG PO TABS
5.0000 mg | ORAL_TABLET | ORAL | Status: DC | PRN
  Administered 2022-05-15: 5 mg via ORAL
  Administered 2022-05-15: 10 mg via ORAL
  Administered 2022-05-15: 5 mg via ORAL
  Administered 2022-05-16 (×2): 10 mg via ORAL
  Filled 2022-05-13: qty 1
  Filled 2022-05-13 (×3): qty 2
  Filled 2022-05-13: qty 1

## 2022-05-13 MED ORDER — DIPHENHYDRAMINE HCL 25 MG PO CAPS: 25.0000 mg | ORAL_CAPSULE | Freq: Four times a day (QID) | ORAL | Status: DC | PRN

## 2022-05-13 MED ORDER — ZOLPIDEM TARTRATE 5 MG PO TABS: 5.0000 mg | ORAL_TABLET | Freq: Every evening | ORAL | Status: DC | PRN

## 2022-05-13 MED ORDER — LIDOCAINE-EPINEPHRINE (PF) 2 %-1:200000 IJ SOLN
INTRAMUSCULAR | Status: DC | PRN
  Administered 2022-05-13 (×2): 4 mL via EPIDURAL
  Administered 2022-05-13 (×2): 5 mL via EPIDURAL

## 2022-05-13 MED ORDER — COCONUT OIL OIL: 1.0000 | TOPICAL_OIL | Status: DC | PRN

## 2022-05-13 MED ORDER — LIDOCAINE-EPINEPHRINE (PF) 2 %-1:200000 IJ SOLN
INTRAMUSCULAR | Status: AC
  Filled 2022-05-13: qty 40

## 2022-05-13 MED ORDER — LACTATED RINGERS IV SOLN: INTRAVENOUS | Status: DC

## 2022-05-13 MED ORDER — PRENATAL MULTIVITAMIN CH
1.0000 | ORAL_TABLET | Freq: Every day | ORAL | Status: DC
  Administered 2022-05-14 – 2022-05-16 (×3): 1 via ORAL
  Filled 2022-05-13 (×3): qty 1

## 2022-05-13 MED ORDER — MENTHOL 3 MG MT LOZG: 1.0000 | LOZENGE | OROMUCOSAL | Status: DC | PRN

## 2022-05-13 MED ORDER — SODIUM CHLORIDE 0.9 % IV SOLN
INTRAVENOUS | Status: DC | PRN
  Administered 2022-05-13: 500 mg via INTRAVENOUS

## 2022-05-13 MED ORDER — SIMETHICONE 80 MG PO CHEW
80.0000 mg | CHEWABLE_TABLET | Freq: Three times a day (TID) | ORAL | Status: DC
  Administered 2022-05-15 – 2022-05-16 (×5): 80 mg via ORAL
  Filled 2022-05-13 (×6): qty 1

## 2022-05-13 MED ORDER — IBUPROFEN 600 MG PO TABS
600.0000 mg | ORAL_TABLET | Freq: Four times a day (QID) | ORAL | Status: DC | PRN
  Administered 2022-05-16: 600 mg via ORAL
  Filled 2022-05-13 (×2): qty 1

## 2022-05-13 MED ORDER — SODIUM CHLORIDE 0.9 % IV SOLN
INTRAVENOUS | Status: AC
  Filled 2022-05-13: qty 5

## 2022-05-13 MED ORDER — SIMETHICONE 80 MG PO CHEW: 80.0000 mg | CHEWABLE_TABLET | ORAL | Status: DC | PRN

## 2022-05-13 MED ORDER — HYDROMORPHONE HCL 1 MG/ML IJ SOLN: 0.2000 mg | INTRAMUSCULAR | Status: DC | PRN

## 2022-05-13 MED ORDER — ONDANSETRON HCL 4 MG/2ML IJ SOLN
INTRAMUSCULAR | Status: AC
  Filled 2022-05-13: qty 4

## 2022-05-13 MED ORDER — OXYTOCIN-SODIUM CHLORIDE 30-0.9 UT/500ML-% IV SOLN: 2.5000 [IU]/h | INTRAVENOUS | Status: AC

## 2022-05-13 MED ORDER — ACETAMINOPHEN 500 MG PO TABS
1000.0000 mg | ORAL_TABLET | Freq: Four times a day (QID) | ORAL | Status: DC
  Administered 2022-05-13 – 2022-05-16 (×11): 1000 mg via ORAL
  Filled 2022-05-13 (×12): qty 2

## 2022-05-13 MED ORDER — CEFAZOLIN SODIUM-DEXTROSE 2-3 GM-%(50ML) IV SOLR
INTRAVENOUS | Status: DC | PRN
  Administered 2022-05-13: 2 g via INTRAVENOUS

## 2022-05-13 MED ORDER — DIBUCAINE (PERIANAL) 1 % EX OINT: 1.0000 | TOPICAL_OINTMENT | CUTANEOUS | Status: DC | PRN

## 2022-05-13 MED ORDER — OXYTOCIN-SODIUM CHLORIDE 30-0.9 UT/500ML-% IV SOLN
INTRAVENOUS | Status: AC
  Filled 2022-05-13: qty 500

## 2022-05-13 MED ORDER — ERYTHROMYCIN 5 MG/GM OP OINT
TOPICAL_OINTMENT | OPHTHALMIC | Status: AC
  Filled 2022-05-13: qty 1

## 2022-05-13 MED ORDER — TETANUS-DIPHTH-ACELL PERTUSSIS 5-2.5-18.5 LF-MCG/0.5 IM SUSY: 0.5000 mL | PREFILLED_SYRINGE | Freq: Once | INTRAMUSCULAR | Status: DC

## 2022-05-13 MED ORDER — FENTANYL CITRATE (PF) 100 MCG/2ML IJ SOLN
INTRAMUSCULAR | Status: AC
  Filled 2022-05-13: qty 2

## 2022-05-13 SURGICAL SUPPLY — 35 items
BENZOIN TINCTURE PRP APPL 2/3 (GAUZE/BANDAGES/DRESSINGS) IMPLANT
CHLORAPREP W/TINT 26ML (MISCELLANEOUS) ×2 IMPLANT
CLAMP CORD UMBIL (MISCELLANEOUS) ×1 IMPLANT
CLOTH BEACON ORANGE TIMEOUT ST (SAFETY) ×1 IMPLANT
DERMABOND ADVANCED (GAUZE/BANDAGES/DRESSINGS)
DERMABOND ADVANCED .7 DNX12 (GAUZE/BANDAGES/DRESSINGS) IMPLANT
DRSG OPSITE POSTOP 4X10 (GAUZE/BANDAGES/DRESSINGS) ×1 IMPLANT
ELECT REM PT RETURN 9FT ADLT (ELECTROSURGICAL) ×1
ELECTRODE REM PT RTRN 9FT ADLT (ELECTROSURGICAL) ×1 IMPLANT
EXTRACTOR VACUUM M CUP 4 TUBE (SUCTIONS) IMPLANT
GAUZE SPONGE 4X4 12PLY STRL LF (GAUZE/BANDAGES/DRESSINGS) IMPLANT
GLOVE BIO SURGEON STRL SZ7.5 (GLOVE) ×1 IMPLANT
GLOVE BIOGEL PI IND STRL 7.0 (GLOVE) ×1 IMPLANT
GOWN STRL REUS W/TWL LRG LVL3 (GOWN DISPOSABLE) ×2 IMPLANT
KIT ABG SYR 3ML LUER SLIP (SYRINGE) ×1 IMPLANT
NDL HYPO 25X5/8 SAFETYGLIDE (NEEDLE) ×1 IMPLANT
NEEDLE HYPO 25X5/8 SAFETYGLIDE (NEEDLE) ×1 IMPLANT
NS IRRIG 1000ML POUR BTL (IV SOLUTION) ×1 IMPLANT
PACK C SECTION WH (CUSTOM PROCEDURE TRAY) ×1 IMPLANT
PAD ABD 7.5X8 STRL (GAUZE/BANDAGES/DRESSINGS) IMPLANT
PAD ABD DERMACEA PRESS 5X9 (GAUZE/BANDAGES/DRESSINGS) IMPLANT
PAD OB MATERNITY 4.3X12.25 (PERSONAL CARE ITEMS) ×1 IMPLANT
STRIP CLOSURE SKIN 1/2X4 (GAUZE/BANDAGES/DRESSINGS) IMPLANT
STRIP SURGICAL 1/4 X 6 IN (GAUZE/BANDAGES/DRESSINGS) IMPLANT
SUT MNCRL 0 VIOLET CTX 36 (SUTURE) ×4 IMPLANT
SUT MONOCRYL 0 CTX 36 (SUTURE) ×4
SUT PDS AB 0 CTX 60 (SUTURE) ×1 IMPLANT
SUT PLAIN 0 NONE (SUTURE) IMPLANT
SUT PLAIN 2 0 (SUTURE)
SUT PLAIN 2 0 XLH (SUTURE) IMPLANT
SUT PLAIN ABS 2-0 CT1 27XMFL (SUTURE) IMPLANT
SUT VIC AB 4-0 KS 27 (SUTURE) ×1 IMPLANT
TOWEL OR 17X24 6PK STRL BLUE (TOWEL DISPOSABLE) ×1 IMPLANT
TRAY FOLEY W/BAG SLVR 14FR LF (SET/KITS/TRAYS/PACK) ×1 IMPLANT
WATER STERILE IRR 1000ML POUR (IV SOLUTION) ×1 IMPLANT

## 2022-05-13 NOTE — Anesthesia Postprocedure Evaluation (Signed)
Anesthesia Post Note  Patient: Amanda Morton  Procedure(s) Performed: CESAREAN SECTION     Patient location during evaluation: Mother Baby Anesthesia Type: Epidural Level of consciousness: awake and alert Pain management: pain level controlled Vital Signs Assessment: post-procedure vital signs reviewed and stable Respiratory status: spontaneous breathing, nonlabored ventilation and respiratory function stable Cardiovascular status: stable Postop Assessment: no headache, no backache and epidural receding Anesthetic complications: no   No notable events documented.  Last Vitals:  Vitals:   05/13/22 1945 05/13/22 2000  BP: 127/67 120/77  Pulse: 93 82  Resp: 18 19  Temp:    SpO2: 100% 98%    Last Pain:  Vitals:   05/13/22 2000  TempSrc:   PainSc: 0-No pain   Pain Goal:    LLE Motor Response: Purposeful movement (05/13/22 2000) LLE Sensation: Tingling (05/13/22 2000) RLE Motor Response: Purposeful movement (05/13/22 2000) RLE Sensation: Tingling (05/13/22 2000)     Epidural/Spinal Function Cutaneous sensation: Tingles (05/13/22 2000), Patient able to flex knees: Yes (05/13/22 2000), Patient able to lift hips off bed: Yes (05/13/22 2000), Back pain beyond tenderness at insertion site: No (05/13/22 2000), Progressively worsening motor and/or sensory loss: No (05/13/22 2000), Bowel and/or bladder incontinence post epidural: No (05/13/22 2000)  Elspeth Blucher

## 2022-05-13 NOTE — Progress Notes (Signed)
Amanda Morton is a 26 y.o. G1P0 at [redacted]w[redacted]d by ultrasound admitted for augmentation of labor  Subjective: Comfortable with CLEA  Objective: BP 105/64   Pulse 73   Temp 98.9 F (37.2 C)   Resp 16   Ht 5\' 1"  (1.549 m)   Wt 72.1 kg   SpO2 100%   BMI 30.04 kg/m  No intake/output data recorded. No intake/output data recorded.  FHT:  FHR: 140 bpm, variability: moderate,  accelerations:  Present,  decelerations:  Absent UC:   regular, every 2 minutes SVE:   4/90/-1  Labs: Lab Results  Component Value Date   WBC 8.1 05/12/2022   HGB 11.9 (L) 05/12/2022   HCT 35.9 (L) 05/12/2022   MCV 87.8 05/12/2022   PLT 231 05/12/2022    Assessment / Plan: Augmentation of labor  Labor: Progressing normally and now s/p AROM with moderate meconium Preeclampsia:   n/a Fetal Wellbeing:  Category I Pain Control:  Epidural I/D:  n/a Anticipated MOD:  NSVD  07/12/2022, DO 05/13/2022, 3:59 AM

## 2022-05-13 NOTE — Progress Notes (Signed)
FHT cat one Cx no change 6/C/-2  A/P: Arrest of dilation. D/W patient lack of progress and high station. Offered one more check but she states she is comfortable with C/S now. D/W procedure and risks including infection, organ damage, bleeding/transfusion-HIV/Hep, DVT/PE, pneumonia, wound breakdown. She states she understands and agrees. JET

## 2022-05-13 NOTE — H&P (Signed)
Amanda Morton is a 26 y.o. female G1 at [redacted]w[redacted]d presenting for latent labor.  Patient presented to MAU 3 times within 2 days for labor check with slow cervical change; admitted to augment.  Antepartum course uncomplicated.  GBS positive.  Rh negative.  OB History     Gravida  1   Para      Term      Preterm      AB      Living         SAB      IAB      Ectopic      Multiple      Live Births             Past Medical History:  Diagnosis Date   Abdominal pain    Diarrhea    Lactose intolerance    Medical history non-contributory    Myopia    Past Surgical History:  Procedure Laterality Date   NO PAST SURGERIES     Family History: family history includes Delayed puberty in her mother. Social History:  reports that she has never smoked. She has never used smokeless tobacco. She reports that she does not drink alcohol and does not use drugs.     Maternal Diabetes: No Genetic Screening: Normal Maternal Ultrasounds/Referrals: Normal Fetal Ultrasounds or other Referrals:  None Maternal Substance Abuse:  No Significant Maternal Medications:  None Significant Maternal Lab Results:  Group B Strep positive and Rh negative Number of Prenatal Visits:greater than 3 verified prenatal visits Other Comments:  None  Review of Systems Maternal Medical History:  Reason for admission: Contractions.   Contractions: Onset was more than 2 days ago.   Frequency: regular.   Perceived severity is moderate.   Fetal activity: Perceived fetal activity is normal.   Last perceived fetal movement was within the past hour.   Prenatal complications: no prenatal complications Prenatal Complications - Diabetes: none.   Dilation: 2.5 Effacement (%): 80 Station: -3 Exam by:: [employer]  RN Blood pressure 113/63, pulse 84, temperature 97.8 F (36.6 C), temperature source Oral, resp. rate 16, height 5\' 1"  (1.549 m), weight 72.1 kg, SpO2 100 %. Maternal Exam:  Uterine  Assessment: Contraction strength is moderate.  Contraction frequency is regular.  Abdomen: Patient reports no abdominal tenderness. Fundal height is c/w dates.   Estimated fetal weight is 7#6.     Fetal Exam Fetal Monitor Review: Baseline rate: 140.  Variability: moderate (6-25 bpm).   Pattern: accelerations present and no decelerations.   Fetal State Assessment: Category I - tracings are normal.   Physical Exam Constitutional:      Appearance: Normal appearance.  HENT:     Head: Normocephalic and atraumatic.  Pulmonary:     Effort: Pulmonary effort is normal.  Abdominal:     Palpations: Abdomen is soft.  Musculoskeletal:        General: Normal range of motion.     Cervical back: Normal range of motion.  Skin:    General: Skin is warm and dry.  Neurological:     Mental Status: She is alert and oriented to person, place, and time.  Psychiatric:        Mood and Affect: Mood normal.        Behavior: Behavior normal.     Prenatal labs: ABO, Rh: --/--/O NEG (09/04 2114) Antibody: NEG (09/04 2114) Rubella: Immune (03/07 0000) RPR: Nonreactive (03/07 0000)  HBsAg: Negative (03/07 0000)  HIV: Non-reactive (06/16 0000)  GBS:  Positive/-- (03/07 0000)   Assessment/Plan: 26yo G1 at [redacted]w[redacted]d with latent labor -Pitocin started -Epidural placed -Anticipate NSVD   Mitchel Honour 05/13/2022, 12:25 AM

## 2022-05-13 NOTE — Progress Notes (Signed)
FHT cat one UCs MVU 160 Cx 5/90/-2

## 2022-05-13 NOTE — Progress Notes (Signed)
Late entry note.  I was in delivery for earlier events.  Around 0500, patient began having late decelerations.  Pitocin was discontinued, patient repositioned and fluid bolus given.  This improved fetal tracing with resolution of decelerations.  SVE 5/90/-1.  Patient continued to have regular CTX q 2-3 minutes.  Again, around 0620, late decelerations recurred and improved with repositioning.  Will closely monitor.  Mitchel Honour, DO

## 2022-05-13 NOTE — Op Note (Signed)
NAMELASHAI, GROSCH MEDICAL RECORD NO: [id number] ACCOUNT NO: [id number] DATE OF BIRTH: April 13, 1996 FACILITY: [facility] LOCATION: [facility] PHYSICIAN: Guy Sandifer. Arleta Creek, MD  Operative Report   DATE OF PROCEDURE: 05/13/2022  PREOPERATIVE DIAGNOSIS:  Arrested dilation.  POSTOPERATIVE DIAGNOSIS:  Arrested dilation.  PROCEDURE:  Primary low transverse cesarean section.  SURGEON:  Harold Hedge, MD  ANESTHESIA:  Epidural, Laqueta Due, MD  ESTIMATED BLOOD LOSS:  722 mL  SPECIMENS:  Placenta to pathology.  FINDINGS:  Viable female infant.  Apgars, birth weight, arterial cord pH pending.  INDICATIONS AND CONSENT:  This patient is a 26 year old G1 P0 at 38 and 3, who presents in early labor.  She progressed just to 6 cm, -2 station with caput formation and no change.  Cesarean section is discussed.  Potential risks and complications are  discussed preoperatively including but not limited to infection, organ damage, bleeding requiring transfusion of blood products with HIV and hepatitis acquisition, DVT, PE, pneumonia, wound breakdown.  She states she understands and agrees and consent is  signed on the chart.  DESCRIPTION OF PROCEDURE:  The patient was taken to the operating room where she was identified.  Her epidural anesthetic was augmented to a surgical level and she was placed in the dorsal supine position with a 15-degree left lateral wedge.  Vagina was  prepped with Betadine.  Foley catheter was already in place.  She was prepped abdominally with ChloraPrep.  Timeout was done.  After 3-minute drying time, she was draped in a sterile fashion.  After testing for adequate epidural anesthesia skin was  entered through a Pfannenstiel incision and dissection was carried out in layers to the peritoneum, which was taken down superiorly and inferiorly.  Vesicouterine peritoneum was taken down cephalad laterally.  Bladder flap developed and the bladder blade  was placed.  Uterus was incised in a low  transverse manner and the uterine cavity was entered bluntly with a hemostat.  Moderate meconium was noted.  The incision was extended with the fingers.  Vertex was delivered.  A loose nuchal cord x1 was noted.   Baby was delivered.  A good cry and tone is noted.  Cord was clamped and cut and the baby was handed to waiting pediatrics team at 1 minute.  Placenta was delivered and sent to pathology.  Inspection reveals the uterine cavity to be clean.  Uterus was  closed in 2 running locking imbricating layers of 0 Monocryl suture, which achieved good hemostasis.  Lavage was carried out and all returned as clear.  Anterior peritoneum was closed in running fashion with 0 Monocryl suture, which was also used to  reapproximate the pyramidalis muscle in the midline.  Anterior rectus fascia was closed in running fashion with a double looped 0 PDS suture.  The subcutaneous layer was closed with interrupted plain and the skin was closed in a subcuticular fashion with  a 4-0 Vicryl on a Keith needle.  Benzoin, Steri-Strips, honeycomb and pressure dressing were applied.  All counts were correct.  The patient was taken to recovery room in stable condition.   SHY D: 05/13/2022 6:46:59 pm T: 05/13/2022 9:28:00 pm  JOB: [id number]/ [id number]

## 2022-05-13 NOTE — Brief Op Note (Signed)
05/12/2022 - 05/13/2022  6:41 PM  PATIENT:  Amanda Morton  26 y.o. female  PRE-OPERATIVE DIAGNOSIS:  Primary Cesarean Section arrest of dilatation  POST-OPERATIVE DIAGNOSIS:  Primary Cesarean Section arrest of dilatation  PROCEDURE:  Procedure(s): CESAREAN SECTION (N/A)  SURGEON:  Surgeon(s) and Role:    * Harold Hedge, MD - Primary  PHYSICIAN ASSISTANT:   ASSISTANTS: none   ANESTHESIA:   epidural  EBL:  722 ml   BLOOD ADMINISTERED:none  DRAINS: Urinary Catheter (Foley)   LOCAL MEDICATIONS USED:  NONE  SPECIMEN:  Source of Specimen:  placenta  DISPOSITION OF SPECIMEN:  PATHOLOGY  COUNTS:  YES  TOURNIQUET:  * No tourniquets in log *  DICTATION: .Other Dictation: Dictation Number [id number]  PLAN OF CARE: Admit to inpatient   PATIENT DISPOSITION:  PACU - hemodynamically stable.   Delay start of Pharmacological VTE agent (>24hrs) due to surgical blood loss or risk of bleeding: not applicable

## 2022-05-13 NOTE — Transfer of Care (Signed)
Immediate Anesthesia Transfer of Care Note  Patient: Amanda Morton  Procedure(s) Performed: CESAREAN SECTION  Patient Location: PACU  Anesthesia Type:Epidural  Level of Consciousness: awake, alert  and patient cooperative  Airway & Oxygen Therapy: Patient Spontanous Breathing  Post-op Assessment: Report given to RN and Post -op Vital signs reviewed and stable  Post vital signs: Reviewed and stable  Last Vitals:  Vitals Value Taken Time  BP 102/52 05/13/22 1900  Temp    Pulse 110 05/13/22 1902  Resp 20 05/13/22 1902  SpO2 100 % 05/13/22 1902  Vitals shown include unvalidated device data.  Last Pain:  Vitals:   05/13/22 1452  TempSrc: Oral  PainSc:          Complications: No notable events documented.

## 2022-05-13 NOTE — Progress Notes (Signed)
FHT cat one UCs q2-45min Cx 4-5/90/-3 IUPC placed Pitocin off

## 2022-05-14 ENCOUNTER — Encounter (HOSPITAL_COMMUNITY): Payer: Self-pay | Admitting: Obstetrics and Gynecology

## 2022-05-14 LAB — CBC
HCT: 25.8 % — ABNORMAL LOW (ref 36.0–46.0)
Hemoglobin: 8.9 g/dL — ABNORMAL LOW (ref 12.0–15.0)
MCH: 30 pg (ref 26.0–34.0)
MCHC: 34.5 g/dL (ref 30.0–36.0)
MCV: 86.9 fL (ref 80.0–100.0)
Platelets: 183 10*3/uL (ref 150–400)
RBC: 2.97 MIL/uL — ABNORMAL LOW (ref 3.87–5.11)
RDW: 13.3 % (ref 11.5–15.5)
WBC: 17.8 10*3/uL — ABNORMAL HIGH (ref 4.0–10.5)
nRBC: 0 % (ref 0.0–0.2)

## 2022-05-14 LAB — KLEIHAUER-BETKE STAIN
# Vials RhIg: 1
Fetal Cells %: 0 %
Quantitation Fetal Hemoglobin: 0 mL

## 2022-05-14 MED ORDER — RHO D IMMUNE GLOBULIN 1500 UNIT/2ML IJ SOSY
300.0000 ug | PREFILLED_SYRINGE | Freq: Once | INTRAMUSCULAR | Status: AC
  Administered 2022-05-14: 300 ug via INTRAVENOUS
  Filled 2022-05-14: qty 2

## 2022-05-14 NOTE — Lactation Note (Signed)
This note was copied from a baby's chart. Lactation Consultation Note  Patient Name: Amanda Morton [id number] Date: 05/14/2022 Reason for consult: Follow-up assessment;Primapara;Term;1st time breastfeeding Age:24 hours   P1: Term infant at 39+3 weeks Feeding preference: Breast Weight loss: 1%  Baby in nursery for a circumcision when I arrived.  Reviewed breast feeding basics.  Birth parent familiar with hand expression and is able to express colostrum drops.  Advised to feed any expressed drops to baby.  Birth parent has a small amount of colostrum in her refrigerator and will feed back to baby after the circumcision. Encouraged feeding 8-12 times/24 hours or sooner if baby shows cues.  Suggested she call her RN/LC for latch assistance as needed.  Last LATCH score was a 7; voiding/stooling. Discussed cluster feeding for tonight.  Visitor present.   Maternal Data Has patient been taught Hand Expression?: Yes Does the patient have breastfeeding experience prior to this delivery?: No  Feeding Mother's Current Feeding Choice: Breast Milk  LATCH Score                    Lactation Tools Discussed/Used Tools: Pump Breast pump type: Manual Pump Education: Setup, frequency, and cleaning;Milk Storage (No review needed) Reason for Pumping: Birth parent's request Pumping frequency: prn  Interventions    Discharge Pump: Personal (Medela)  Consult Status Consult Status: Follow-up Date: 05/15/22 Follow-up type: In-patient    Tanay Misuraca R Lido Maske 05/14/2022, 10:08 AM

## 2022-05-14 NOTE — Lactation Note (Signed)
This note was copied from a baby's chart. Lactation Consultation Note  Patient Name: Amanda Morton [id number] Date: 05/14/2022 Reason for consult: Initial assessment;1st time breastfeeding;Term (C/S delivery) Age:24 hours, P1, term female infant. Birth Parent has DEBP at home.  Per Birth Parent, she feels infant is breastfeeding well, infant has BF 3 X since birth , feedings are 20 to 30 minutes in length. Infant recently BF for 25 minutes at 2302 pm, LC did not observe latch, infant was asleep on Birth Parent's chest STS.  Birth Parent plans to continue to BF infant according to hunger cues, on demand, 8 to 12+ times within 24 hrs, STS. Birth Parent knows if infant latches on the first breast and still cuing afterwards she can latch infant on the 2nd breast during the same feeding. Birth Parent knows to call RN/LC for latch assistance if need. Birth Parent was shown how to hand express by RN. Birth Parent had  5 mls of colostrum  sitting on the counter in room that she has pumped using a hand pump, she plans to offer infant her EBM after latching infant at the breast for the next feeding.  Maternal Data Has patient been taught Hand Expression?: Yes Does the patient have breastfeeding experience prior to this delivery?: No  Feeding Mother's Current Feeding Choice: Breast Milk  LATCH Score                    Lactation Tools Discussed/Used Tools: Pump Breast pump type: Manual Pumped volume: 5 mL  Interventions Interventions: Breast feeding basics reviewed;Skin to skin;Support pillows;Position options;Education;LC Services brochure  Discharge    Consult Status Consult Status: Follow-up Date: 05/14/22 Follow-up type: In-patient    Amanda Morton 05/14/2022, 1:07 AM

## 2022-05-14 NOTE — Social Work (Signed)
MOB was referred for history of depression/anxiety.  * Referral screened out by Clinical Social Worker because none of the following criteria appear to apply:  ~ History of anxiety/depression during this pregnancy, or of post-partum depression following prior delivery.  ~ Diagnosis of anxiety and/or depression within last 3 years OR * MOB's symptoms currently being treated with medication and/or therapy.  Per chart review MOB OB records MOB does not have Bipolar D/O, No noted Anxiety during pregnancy. Anxiety and depression was diagnosed on or before Septermber 2020.  Please contact the Clinical Social Worker if needs arise, or by MOB request.  Teodor Prater, LCSWA Clinical Social Worker [phone]  

## 2022-05-14 NOTE — Progress Notes (Signed)
Subjective: Postpartum Day 1: Cesarean Delivery s/s arrest of dilation.  Patient reports tolerating PO and + flatus.    Objective: Vital signs in last 24 hours: Temp:  [97.5 F (36.4 C)-99.1 F (37.3 C)] 98.2 F (36.8 C) (09/06 0825) Pulse Rate:  [73-285] 92 (09/06 0825) Resp:  [16-23] 20 (09/06 0825) BP: (102-135)/(52-94) 126/63 (09/06 0825) SpO2:  [96 %-100 %] 99 % (09/06 0825)  Physical Exam:  General: alert Lochia: appropriate Uterine Fundus: firm Incision: healing well, pressure dressing in place, no strike-through DVT Evaluation: No evidence of DVT seen on physical exam.  Recent Labs    05/12/22 2115 05/14/22 0606  HGB 11.9* 8.9*  HCT 35.9* 25.8*    Assessment/Plan: Status post Cesarean section. Doing well postoperatively.  Continue current care. D/W patient female infant circumcision, risks/benefits reviewed. All questions answered.   Ranae Pila, MD 05/14/2022, 8:32 AM

## 2022-05-15 LAB — SURGICAL PATHOLOGY

## 2022-05-15 LAB — RH IG WORKUP (INCLUDES ABO/RH)
Gestational Age(Wks): 39
Unit division: 0

## 2022-05-15 NOTE — Lactation Note (Signed)
This note was copied from a baby's chart. Lactation Consultation Note  Patient Name: Amanda Morton [id number] Date: 05/15/2022   Age:24 hours Attempted to see BF parent. BF parent was holding baby giving formula all day today per parent. She stated she was frustrated not knowing how much he was getting not feeling like it was enough and she is sore. Asked BF parent to call for [facility] if she would like assistance in BF.  Maternal Data    Feeding    LATCH Score                    Lactation Tools Discussed/Used    Interventions    Discharge    Consult Status      Amanda Morton 05/15/2022, 8:54 PM

## 2022-05-15 NOTE — Progress Notes (Signed)
POD # 2  Patient still feeling sore.    BP 116/72   Pulse 79   Temp 98 F (36.7 C) (Oral)   Resp 18   Ht 5\' 1"  (1.549 m)   Wt 72.1 kg   SpO2 99%   Breastfeeding Unknown   BMI 30.04 kg/m  No results found for this or any previous visit (from the past 24 hour(s)). Scheduled Meds:  acetaminophen  1,000 mg Oral Q6H   prenatal multivitamin  1 tablet Oral Q1200   senna-docusate  2 tablet Oral Daily   simethicone  80 mg Oral TID PC   Tdap  0.5 mL Intramuscular Once   Continuous Infusions:  lactated ringers     PRN Meds:.coconut oil, witch hazel-glycerin **AND** dibucaine, diphenhydrAMINE, HYDROmorphone (DILAUDID) injection, ibuprofen, menthol-cetylpyridinium, oxyCODONE, simethicone, zolpidem General alert and oriented Abdomen uterus is firm but slightly tender Bandage - pressure dressing removed and slightly saturated with old drainage  Honeycomb is saturated  IMPRESSION: POD # 2  Doing ok Change honeycomb Continue pain meds Discharge home tomorrow

## 2022-05-16 MED ORDER — OXYCODONE HCL 5 MG PO TABS: 5.0000 mg | ORAL_TABLET | ORAL | 0 refills | Status: AC | PRN

## 2022-05-16 MED ORDER — IBUPROFEN 600 MG PO TABS
600.0000 mg | ORAL_TABLET | Freq: Four times a day (QID) | ORAL | 0 refills | Status: AC | PRN
Start: 2022-05-16 — End: ?

## 2022-05-16 MED ORDER — DOCUSATE SODIUM 100 MG PO CAPS: 100.0000 mg | ORAL_CAPSULE | Freq: Every day | ORAL | 0 refills | Status: AC

## 2022-05-16 MED ORDER — FERROUS SULFATE 325 (65 FE) MG PO TABS
325.0000 mg | ORAL_TABLET | ORAL | 3 refills | Status: AC
Start: 2022-05-16 — End: ?

## 2022-05-16 MED ORDER — ACETAMINOPHEN 325 MG PO TABS: 650.0000 mg | ORAL_TABLET | Freq: Four times a day (QID) | ORAL | 0 refills | Status: AC | PRN

## 2022-05-16 MED ORDER — FERROUS SULFATE 325 (65 FE) MG PO TABS
325.0000 mg | ORAL_TABLET | ORAL | Status: DC
  Administered 2022-05-16: 325 mg via ORAL
  Filled 2022-05-16: qty 1

## 2022-05-16 MED ORDER — DOCUSATE SODIUM 100 MG PO CAPS
100.0000 mg | ORAL_CAPSULE | Freq: Every day | ORAL | Status: DC
  Administered 2022-05-16: 100 mg via ORAL
  Filled 2022-05-16: qty 1

## 2022-05-16 NOTE — Progress Notes (Signed)
Postpartum Progress Note  Postpartum Day 3 s/p primary Cesarean section.  Subjective:  Patient reports no overnight events.  She reports well controlled pain, ambulating without difficulty, voiding spontaneously, tolerating PO.  She reports Positive flatus, Positive BM.  Vaginal bleeding is appropriate.  Objective: Blood pressure (!) 127/57, pulse 95, temperature 98.5 F (36.9 C), temperature source Oral, resp. rate 16, height 5\' 1"  (1.549 m), weight 72.1 kg, SpO2 98 %, unknown if currently breastfeeding.  Physical Exam:  General: alert and no distress Lochia: appropriate Uterine Fundus: firm Incision: dressing in place DVT Evaluation: No evidence of DVT seen on physical exam.  Recent Labs    05/14/22 0606  HGB 8.9*  HCT 25.8*    Assessment/Plan: Postpartum Day 3, s/p C-section Acute blood loss anemia - Fe/Colace. No signs or symptoms of anemia.  Lactation following Baby boy -s/p circ Doing well, continue routine postpartum care. Anticipate discharge today   LOS: 4 days   07/14/22 05/16/2022, 7:44 AM

## 2022-05-16 NOTE — Discharge Summary (Signed)
Obstetric Discharge Summary  Amanda Morton is a 26 y.o. female that presented on 05/12/2022 for labor augmentation.  Her labor course was complicated by arrest of dilatation and she delivered a viable female infant on 05/13/2022.  Her postpartum course was uncomplicated and on PPD#3, she reported well controlled pain, spontaneous voiding, ambulating without difficulty, and tolerating PO.  She was stable for discharge home on 05/16/2022 with plans for in-office follow up.  Hemoglobin  Date Value Ref Range Status  05/14/2022 8.9 (L) 12.0 - 15.0 g/dL Final    Comment:    REPEATED TO VERIFY   HCT  Date Value Ref Range Status  05/14/2022 25.8 (L) 36.0 - 46.0 % Final    Physical Exam:  General: alert and no distress Lochia: appropriate Uterine Fundus: firm Incision: healing well DVT Evaluation: No evidence of DVT seen on physical exam.  Discharge Diagnoses: Term Pregnancy-delivered  Discharge Information: Date: 05/16/2022 Activity: Pelvic rest, as tolerated Diet: routine Medications: Tylenol, motrin, oxycodone, iron sulfate, colace Condition: stable Instructions: Refer to practice specific booklet.  Discussed prior to discharge.  Discharge to: Home  Follow-up Information     [place] Follow up.   Why: Please follow up for a 6 week postpartum visit. Contact information: [address] [place] [address] [phone]                 Newborn Data: Live born female  Birth Weight: 8 lb 8.5 oz (3870 g) APGAR: 8, 9  Newborn Delivery   Birth date/time: 05/13/2022 18:09:00 Delivery type: C-Section, Low Transverse Trial of labor: Yes C-section categorization: Primary      Home with mother.  Lyn Henri 05/16/2022, 10:27 PM

## 2022-05-16 NOTE — Lactation Note (Signed)
This note was copied from a baby's chart. Lactation Consultation Note  Patient Name: Amanda Morton [id number] Date: 05/16/2022 Reason for consult: Follow-up assessment;1st time breastfeeding;Primapara;Term;Nipple pain/trauma Age:24 hours  LC in to visit with P1 Mom of term baby on day of discharge.  Mom states she became frustrated and started supplementing baby with formula due to not being confident baby was getting enough at the breast, sore nipples and baby fussy after breastfeeding.  Talked about what Mom would like to do and she shared that she may just want to pump.  She has a Medela DEBP at home.  Baby had just had 25 ml of formula 30 mins ago, but was showing some feeding cues.  Offered to assist with latching to the breast and Mom in agreement.  Baby opens his mouth wide, and latches deeply, but repeatedly became fussy after a minute.  Baby fed another 10 ml of formula in 30 seconds.  Talked about the importance of pace feeding.  Baby assisted to latch in prone laid back position.   Baby latched and fed comfortably for a few minutes before popping off and fussing.  Extra slow flow nipple provided for next feeding LC set up a DEBP  and assisted with first pumping using 24 mm flanges.  Pumping top provided. Mom agreeable to OP lactation f/u.  Message sent to clinic  Engorgement prevention and treatment reviewed.  Reviewed OP lactation support.   Feeding Nipple Type: Slow - flow  LATCH Score Latch: Repeated attempts needed to sustain latch, nipple held in mouth throughout feeding, stimulation needed to elicit sucking reflex.  Audible Swallowing: None  Type of Nipple: Everted at rest and after stimulation  Comfort (Breast/Nipple): Filling, red/small blisters or bruises, mild/mod discomfort  Hold (Positioning): Assistance needed to correctly position infant at breast and maintain latch.  LATCH Score: 5   Lactation Tools Discussed/Used Tools:  Pump;Flanges;Bottle Flange Size: 24 Breast pump type: Double-Electric Breast Pump;Manual Pump Education: Setup, frequency, and cleaning;Milk Storage Reason for Pumping: Support milk supply/infant supplementing with formula Pumping frequency: Encouraged pumping when baby is supplemmented  Interventions Interventions: Breast feeding basics reviewed;Assisted with latch;Skin to skin;Breast massage;Hand express;Pre-pump if needed;Breast compression;Adjust position;Support pillows;Position options;DEBP;Hand pump;Pace feeding  Discharge Discharge Education: Engorgement and breast care;Warning signs for feeding baby;Outpatient recommendation;Outpatient Epic message sent  Consult Status Consult Status: Complete Date: 05/16/22 Follow-up type: Out-patient    Judee Clara 05/16/2022, 11:14 AM

## 2022-05-22 ENCOUNTER — Telehealth (HOSPITAL_COMMUNITY): Payer: Self-pay | Admitting: *Deleted

## 2022-05-22 NOTE — Telephone Encounter (Signed)
Patient voiced no questions or concerns regarding her health at this time. EPDS=3. Patient voiced no questions or concerns regarding infant at this time. Patient reports infant sleeps in a bassinet on his back. RN reviewed ABCs of safe sleep. Patient verbalized understanding. Patient requested RN email information on hospital's virtual postpartum classes and support groups. Email sent. Deforest Hoyles, RN, 05/22/22, [phone]
